# Patient Record
Sex: Male | Born: 1950 | Race: White | Hispanic: No | Marital: Married
Health system: Northeastern US, Academic
[De-identification: ages and names within clinical notes are randomized; demographics above are authoritative.]

---

## 2016-11-28 LAB — HEMOGLOBIN A1C
Estimated Average Glucose mg/dL (INT/EXT): 170 mg/dL
HEMOGLOBIN A1C % (INT/EXT): 7.7 % — ABNORMAL HIGH (ref 0.0–5.6)

## 2017-04-05 LAB — HEMOGLOBIN A1C
Estimated Average Glucose mg/dL (INT/EXT): 204 mg/dL
HEMOGLOBIN A1C % (INT/EXT): 8.7 % — ABNORMAL HIGH (ref 0.0–5.6)

## 2017-04-05 LAB — CMP (EXT)
ALT/SGPT (EXT): 25 U/L (ref 0–40)
AST/SGOT (EXT): 19 U/L (ref 0–33)
Albumin (EXT): 4.7 g/dL (ref 3.3–4.9)
Alkaline Phosphatase (EXT): 69 U/L (ref 0–130)
Anion Gap (EXT): 13 (ref 4–14)
BUN (EXT): 25 mg/dL — ABNORMAL HIGH (ref 6–20)
Bilirubin, Total (EXT): 0.6 mg/dL (ref 0.3–1.2)
CO2 (EXT): 25 meq/L (ref 20–31)
CalciumCalcium (EXT): 9.9 mg/dL (ref 8.4–10.2)
Chloride (EXT): 100 meq/L (ref 98–107)
Corrected Calcium (EXT): 9.3 mg/dL (ref 8.4–10.2)
Creatinine (EXT): 1.1 mg/dL (ref 0.9–1.3)
Glucose (EXT): 132 mg/dL — ABNORMAL HIGH (ref 70–99)
Potassium (EXT): 4.4 meq/L (ref 3.5–5.3)
Protein (EXT): 7.2 g/dL (ref 6.2–8.2)
Sodium (EXT): 138 meq/L (ref 134–144)
eGFR - Creat MDRD (EXT): 75 mL/min/{1.73_m2} (ref >60)

## 2017-05-28 ENCOUNTER — Ambulatory Visit: Admitting: Cardiovascular Disease

## 2017-11-19 LAB — CMP (EXT)
ALT/SGPT (EXT): 28 U/L (ref 0–40)
AST/SGOT (EXT): 18 U/L (ref 0–33)
Albumin (EXT): 4.6 g/dL (ref 3.3–4.9)
Alkaline Phosphatase (EXT): 74 U/L (ref 0–130)
Anion Gap (EXT): 8 (ref 4–14)
BUN (EXT): 19 mg/dL (ref 6–20)
Bilirubin, Total (EXT): 0.6 mg/dL (ref 0.3–1.2)
CO2 (EXT): 27 meq/L (ref 20–31)
CalciumCalcium (EXT): 9.7 mg/dL (ref 8.4–10.2)
Chloride (EXT): 99 meq/L (ref 98–107)
Corrected Calcium (EXT): 9.2 mg/dL (ref 8.4–10.2)
Creatinine (EXT): 0.9 mg/dL (ref 0.9–1.3)
Glucose (EXT): 263 mg/dL — ABNORMAL HIGH (ref 70–99)
Potassium (EXT): 4.5 meq/L (ref 3.5–5.3)
Protein (EXT): 7.2 g/dL (ref 6.2–8.2)
Sodium (EXT): 134 meq/L (ref 134–144)
eGFR - Creat MDRD (EXT): 88 mL/min/{1.73_m2} (ref >60)

## 2017-11-19 LAB — HEMOGLOBIN A1C
Estimated Average Glucose mg/dL (INT/EXT): 284 mg/dL
HEMOGLOBIN A1C % (INT/EXT): 11.1 % — ABNORMAL HIGH (ref 0.0–5.6)

## 2017-12-21 LAB — LIPID PROFILE (EXT)
Chol/HDL Ratio (EXT): 5.1 — ABNORMAL HIGH (ref <4.96)
Cholesterol (EXT): 199 mg/dL (ref <200)
HDL Cholesterol (EXT): 39 mg/dL — ABNORMAL LOW (ref >40)
LDL Cholesterol, CALC (EXT): 110 mg/dL (ref <129)
Triglycerides (EXT): 251 mg/dL — ABNORMAL HIGH (ref <150)

## 2017-12-21 LAB — MICROALBUMIN URINE (EXT)
Albumin, Urine Random (EXT): <7 ug/mL (ref <30)
Creatinine, urine, random (INT/EXT): 20 mg/dL (ref 20–370)
Microalbumin/Creatinine Ratio Urine (EXT): <35 ug/mg{creat} — ABNORMAL HIGH (ref <30)

## 2018-03-12 LAB — CMP (EXT)
ALT/SGPT (EXT): 19 U/L (ref 0–40)
AST/SGOT (EXT): 14 U/L (ref 0–33)
Albumin (EXT): 4.4 g/dL (ref 3.3–4.9)
Alkaline Phosphatase (EXT): 80 U/L (ref 0–130)
Anion Gap (EXT): 8 (ref 4–14)
BUN (EXT): 16 mg/dL (ref 6–20)
Bilirubin, Total (EXT): 0.5 mg/dL (ref 0.3–1.2)
CO2 (EXT): 28 meq/L (ref 20–31)
CalciumCalcium (EXT): 9.5 mg/dL (ref 8.4–10.2)
Chloride (EXT): 100 meq/L (ref 98–107)
Corrected Calcium (EXT): 9.2 mg/dL (ref 8.4–10.2)
Creatinine (EXT): 0.8 mg/dL — ABNORMAL LOW (ref 0.9–1.3)
Glucose (EXT): 145 mg/dL — ABNORMAL HIGH (ref 70–99)
Potassium (EXT): 4.2 meq/L (ref 3.5–5.3)
Protein (EXT): 6.9 g/dL (ref 6.2–8.2)
Sodium (EXT): 136 meq/L (ref 134–144)
eGFR - Creat MDRD (EXT): >100 mL/min/{1.73_m2} (ref >60)

## 2018-03-12 LAB — HEMOGLOBIN A1C
Estimated Average Glucose mg/dL (INT/EXT): 170 mg/dL
HEMOGLOBIN A1C % (INT/EXT): 7.7 % — ABNORMAL HIGH (ref 0.0–5.6)

## 2018-07-11 LAB — HEMOGLOBIN A1C
Estimated Average Glucose mg/dL (INT/EXT): 154 mg/dL
HEMOGLOBIN A1C % (INT/EXT): 7.2 % — ABNORMAL HIGH (ref 0.0–5.6)

## 2018-07-11 LAB — CMP (EXT)
ALT/SGPT (EXT): 18 U/L (ref 0–40)
AST/SGOT (EXT): 14 U/L (ref 0–33)
Albumin (EXT): 4.6 g/dL (ref 3.3–4.9)
Alkaline Phosphatase (EXT): 65 U/L (ref 0–130)
Anion Gap (EXT): 13 (ref 4–14)
BUN (EXT): 18 mg/dL (ref 6–20)
Bilirubin, Total (EXT): 0.5 mg/dL (ref 0.3–1.2)
CO2 (EXT): 23 meq/L (ref 20–31)
CalciumCalcium (EXT): 9.6 mg/dL (ref 8.4–10.2)
Chloride (EXT): 102 meq/L (ref 98–107)
Corrected Calcium (EXT): 9.1 mg/dL (ref 8.4–10.2)
Creatinine (EXT): 1.1 mg/dL (ref <1.3)
Glucose (EXT): 116 mg/dL — ABNORMAL HIGH (ref 70–99)
Potassium (EXT): 4.2 meq/L (ref 3.5–5.3)
Protein (EXT): 7 g/dL (ref 6.2–8.2)
Sodium (EXT): 138 meq/L (ref 134–144)
eGFR - Creat MDRD (EXT): 74 mL/min/{1.73_m2} (ref >60)

## 2018-11-14 LAB — CMP (EXT)
ALT/SGPT (EXT): 20 U/L (ref 0–40)
AST/SGOT (EXT): 12 U/L (ref 0–33)
Albumin (EXT): 4.3 g/dL (ref 3.3–4.9)
Alkaline Phosphatase (EXT): 67 U/L (ref 0–130)
Anion Gap (EXT): 8 (ref 4–14)
BUN (EXT): 23 mg/dL — ABNORMAL HIGH (ref 6–20)
Bilirubin, Total (EXT): 0.4 mg/dL (ref 0.3–1.2)
CO2 (EXT): 28 meq/L (ref 20–31)
CalciumCalcium (EXT): 9.8 mg/dL (ref 8.4–10.2)
Chloride (EXT): 101 meq/L (ref 98–107)
Corrected Calcium (EXT): 9.6 mg/dL (ref 8.4–10.2)
Creatinine (EXT): 1 mg/dL (ref <1.3)
Glucose (EXT): 199 mg/dL — ABNORMAL HIGH (ref 70–99)
Potassium (EXT): 4.8 meq/L (ref 3.5–5.3)
Protein (EXT): 6.5 g/dL (ref 6.2–8.2)
Sodium (EXT): 137 meq/L (ref 134–144)
eGFR - Creat MDRD (EXT): 83 mL/min/{1.73_m2} (ref >60)

## 2018-11-14 LAB — HEMOGLOBIN A1C
Estimated Average Glucose mg/dL (INT/EXT): 177 mg/dL
HEMOGLOBIN A1C % (INT/EXT): 7.9 % — ABNORMAL HIGH (ref 0.0–5.6)

## 2019-01-21 LAB — CMP (EXT)
ALT/SGPT (EXT): 24 U/L (ref 0–40)
AST/SGOT (EXT): 14 U/L (ref 0–33)
Albumin (EXT): 4.4 g/dL (ref 3.3–4.9)
Alkaline Phosphatase (EXT): 70 U/L (ref 0–130)
Anion Gap (EXT): 8 (ref 4–14)
BUN (EXT): 16 mg/dL (ref 6–20)
Bilirubin, Total (EXT): 0.7 mg/dL (ref 0.3–1.2)
CO2 (EXT): 28 meq/L (ref 20–31)
CalciumCalcium (EXT): 9.6 mg/dL (ref 8.4–10.2)
Chloride (EXT): 99 meq/L (ref 98–107)
Corrected Calcium (EXT): 9.3 mg/dL (ref 8.4–10.2)
Creatinine (EXT): 0.9 mg/dL (ref <1.3)
Glucose (EXT): 156 mg/dL — ABNORMAL HIGH (ref 70–99)
Potassium (EXT): 4.4 meq/L (ref 3.5–5.3)
Protein (EXT): 6.7 g/dL (ref 6.2–8.2)
Sodium (EXT): 135 meq/L (ref 134–144)
eGFR - Creat MDRD (EXT): 88 mL/min/{1.73_m2} (ref >60)

## 2019-01-23 ENCOUNTER — Ambulatory Visit

## 2019-01-23 NOTE — Progress Notes (Signed)
* * *      **  [name]**    ------    57 Y old Male, DOB: 1951-03-04    [address], [place], [place], [place] [address]    Home: [phone]    Provider: Georga Kaufmann        * * *    Telephone Encounter    ---    Answered by  Prior, Jasmine December Date: 01/23/2019       Time: 11:54 AM    Caller  Sarah @ Dr Jillyn Hidden    ------            Reason  Consult            Message                     Consult per Dr Mammie Lorenzo  DX:  EKG changes  , Pre Op Cyst removed from Wrist  Maralyn Sago is going to fax today                Action Taken                     Prior,Sharon  01/23/2019 2:09:50 PM >      patient booked      Coroniti,Diana  01/24/2019 1:41:19 PM >                     * * *                ---          * * *         Patient: Spencer, Adams DOB: 1951-08-02 Provider: Georga Kaufmann  01/23/2019    ---    Note generated by eClinicalWorks EMR/PM Software ([email or web address])

## 2019-01-28 ENCOUNTER — Ambulatory Visit: Admitting: Cardiovascular Disease

## 2019-01-28 NOTE — Progress Notes (Signed)
 * * *      Spencer Adams**    ------    55 Y old Male, DOB: 1951-06-23    Account Number: [id number]    [address] [place], [place]    Home: [phone]    Guarantor: Spencer Adams Insurance: [employer]    PCP: Cindee Crazier, MD Referring: Cindee Crazier, MD    Appointment Facility: [facility]        * * *    01/28/2019 Progress Notes: Corita Diego, MD    ------    ---       **Reason for Appointment**    ---      1\. Initial consultation    ---      **History of Present Illness**    ---     __ :    This 68 year old, married man was scheduled to have a ganglion cyst removed  from his left wrist last Friday by Dr. Lenoard Rad. However, there was concerned  about his electrocardiogram which led to this consultation.    He is active including walking regularly with no cardiac symptoms.    Review of systems is negative for chest pain, dyspnea, PND, orthopnea,  palpitations, edema, dizziness, syncope, TIA, stroke, claudication, bleeding,  fatigue, and no amaurosis fugax.    Of interest, about 10 years ago he underwent a coronary angiogram after  developing palpitations and shortness of breath while fishing on [address]. He  was told his coronaries were completely normal. He has not had any cardiac  testing since that point.    His ECG was read as showing possible inferior and septal infarction. We do not  have an old ECG.    He has risk factors for coronary disease including age, gender,  hyperlipidemia, hypertension, diabetes, remote cigarette smoking and family  history.      **Current Medications**    ---    Taking    * Aspirin 81 MG Tablet Delayed Release 1 tablet Orally Once a day    ---    * Lisinopril 20 MG Tablet 1 tablet Orally Once a day    ---    * Simvastatin 20 MG Tablet 1 tablet in the evening Orally Once a day    ---    * MetFORMIN HCl ER 500 MG Tablet Extended Release 24 Hour 2 tablets am, 1 tab pm Orally twice a day    ---    * Medication List reviewed and reconciled  with the patient    ---      **Active Problem List**    ---      Z82.49 Family history of ischemic heart disease and other diseases of the  circulatory system    ------    I10 Hypertension    E78.5 Hyperlipidemia, unspecified    R94.31 Abnormal electrocardiogram [ECG] [EKG]      **Past Medical History**    ---      Hyperlipidemia.        ---    Type II diabetes.        ---    Morbid obestiy.        ---    Hypertension.        ---    Obsturctive sleep apnea.        ---    Ganglion cyst.        ---    Abdominal hernia repair with mesh.        ---  Remote smoking in his 4s.        ---    Family history of heart disease his sister and his father had vascular  dementia.        ---    abnormal ECG with inferior Q waves and poor R wave progression.        ---    coronary angiogram around 2010 at [facility] which was reportedly normal.        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        ---      **Cardiovascular Diagnostics**    ---      coronary angiogram around 2010 the [address] was reportedly normal.    ---     **Family History**    ---      His father had vascular dementia. His sister had heart disease. His other  sister and brother do not have heart.    ---      **Social History**    ---     _Tobacco Use:_    Smoking Patient is a Former smoker, How long has it been since you last  smoked? > 10 years.    _Alcohol History:_    Alcohol Screening Have you had a drink in the past year? No.    He exercises regularly.      **Allergies**    ---      N.K.D.A.    ---      **Review of Systems**    ---     _General/Constitutional_ :    Patient denies fever, chills, night sweats.    _Endocrine_ :    Patient denies polydipsia, polyphagia, hot and cold intolerance.    _Respiratory_ :    Patient denies cough, wheezing.    _Gastrointestinal_ :    Patient denies melena, hematochezia, hematemesis.    _Hematology_ :    Patient denies easy bruising.    _Genitourinary_ :    Patient denies blood in  the urine.    _Musculoskeletal_ :    Patient denies arthralgias, myalgias.    _Peripheral Vascular_ :    Patient denies claudication.    _Skin_ :    Patient denies rash.    _Neurologic_ :    Patient denies focal numbness or weakness, visual disturbance, difficulty  speaking.         **Vital Signs**    ---    BP **137/89** , HR **106** , Ht **70** , Wt **250** , BMI **35.87** , Oxygen  sat % **96**    R137/89    L 150/94.      **Physical Examination**    ---     _General Examination_ :    GENERAL APPEARANCE: Alert, well nourished, chaperone present in room.    HEAD: Normocephalic.    EYES: Symmetric pupils, anicteric sclera.    ORAL CAVITY: Moist mucous membranes .    NECK/THYROID: No jugular venous distention, normal carotid upstrokes, no  bruits .    SKIN: No rashes.    HEART: Regular rhythm, normal S1, normal S2, no S3, positive S4, no click, no  rub .    LUNGS: Clear to auscultation bilaterally .    CHEST: No costochondral tenderness.    ABDOMEN: Soft, positive bowel sounds, no abdominal or flank bruits, no  hepatosplenomegaly.    MUSCULOSKELETAL: No muscular tenderness.    EXTREMITIES: No edema.    PERIPHERAL PULSES: Posterior tibial pulses are normal and symmetric  bilaterally, radial pulses are normal and symmetric bilaterally.    NEUROLOGIC: Equal strength and sensation bilaterally .    PSYCH: Alert and oriented .         **EKG**    ---    Sinus rhythm at 103 bpm Q waves in lead III and aVF as well as poor R wave  progression. These could be normal variant QRS findings.      **Assessment and Plan**    ---    1\. Family history of ischemic heart disease and other diseases of the  circulatory system - Z82.49    ---    2\. Hypertension - I10    ---    3\. Hyperlipidemia, unspecified - E78.5    ---    4\. Abnormal electrocardiogram [ECG] [EKG] - R94.31    ---     IMPRESSION:    This 68 year old man is active with no cardiac symptoms. His ECG shows  suggestive but not diagnostic findings for possible old  inferior infarction  and with poor R wave progression. As he has risk factors for coronary artery  disease, this may well represent a cardiac event in the past. However, with an  active lifestyle without cardiac symptoms, I think he could proceed with  ganglion cyst surgery with no additional cardiac testing.    For thorough evaluation, we have decided to do an echocardiogram and a  pharmacological nuclear stress test. If these tests are normal, he could  consider a coronary calcium score. Of note, he cannot exercise well on a  treadmill because he is disabled secondary to spine disease.    If never done, he should have screening for AAA as he is male, between 48 and  44 and a previous smoker. He may have had imaging of his aorta incidentally  around the time he had hernia surgery on his abdomen.    I recommend he continue aspirin, simvastatin and lisinopril.    I will speak with him on the phone after his test results return.    Thank you for allowing me to take part in your patient's care. Please keep me  up to date with any blood tests or other relevant clinical information for  this patient.    With warm regards.    ---      **Treatment**    ---      **1\. Others**    Continue Aspirin Tablet Delayed Release, 81 MG, 1 tablet, Orally, Once a day    Continue Lisinopril Tablet, 20 MG, 1 tablet, Orally, Once a day    Continue Simvastatin Tablet, 20 MG, 1 tablet in the evening, Orally, Once a  day    ---       **Diagnostic Imaging**    ------    _Imaging: Electrocardiogram (EKG)_    ---    _Imaging: Myocardial Perfusion Imaging - Pharmacologic with Exercise_    _Imaging: Echo with 2D Doppler & Color Flow, transthoracic; with contrast if  indicated per protocol_      **Preventive Medicine**    ---      Counseling: Exercise Exercise 30 minutes a day, BMI Management Yes. Diet  Low Salt 2000 mg sodium per day, Avoid Desserts, Avoid Snacks, Avoid Fast  Foods, Avoid Processed Foods, Weight Reduction, Mediterranean  (PREDIMED) diet.    ---     **Follow Up**    ---    6 Months (Reason: abnormal ECG, diabetes, hypertension, hyperlipidemia, family  history of heart disease and remote cigarette  usage.)    Electronically signed by Corita Diego , MD on 01/28/2019 at 02:40 PM EDT    Sign off status: Completed        * * [facility]    [address]    [place], [place] [id number]    Tel: [phone]    Fax: [phone]              * * *         Patient: Spencer Adams, Spencer Adams DOB: 1951-05-19 Progress Note: Corita Diego, MD  01/28/2019    ---    Note generated by eClinicalWorks EMR/PM Software ([email or web address])

## 2019-01-30 ENCOUNTER — Ambulatory Visit

## 2019-01-31 ENCOUNTER — Ambulatory Visit: Admitting: Cardiovascular Disease

## 2019-01-31 NOTE — Progress Notes (Signed)
* * *      **  [name]**    ------    89 Y old Male, DOB: [birth year]    [address] [place], [place], [place], [place] [address]    Home: [phone]    Provider: Leodis Rains        * * *    Telephone Encounter    ---    Answered by  Theo Dills Date: 01/31/2019       Time: 03:55 PM    Reason  Meds for 02/03/19 nuc    ------            Message                     Nurse, nurse:  Pt having pharm w/ exercise nuc Mon 02/03/19 at 9:45 ordered by Dr Sandria Manly; wants to know what to do about his meds for night before & morning of;  also would like results of his echo that he had 01/30/19; results in ecw.  [phone].  Allayne Butcher                Action Taken                     Gavric,Lilly  01/31/2019 4:02:40 PM > Hold metformin and bring it with him  correct? Are you able to give him his echo results? Thanks      Bernardi,Cori  01/31/2019 4:19:03 PM > Spoke to Jonny Ruiz, reviewed echo and med instructions                    * * *                ---          * * *         Patient: Tan, Clopper DOB: [birth year] Provider: Leodis Rains 01/31/2019    ---    Note generated by eClinicalWorks EMR/PM Software ([email or web address])

## 2019-01-31 NOTE — Progress Notes (Signed)
* * *      **  [name]**    ------    58 Y old Male, DOB: 09/11/1951    [address] [place], [place], [place], [place] [address]    Home: [phone]    Provider: Leodis Rains        * * *    Telephone Encounter    ---    Answered by  Leodis Rains Date: 01/31/2019       Time: 03:19 PM    Message                     echo noted        ------            Action Taken                     Alexsia Klindt G 01/31/2019 3:19:51 PM > echocardiogram shows normal left ventricle size and function making the ECG finding a pulse abnormality.  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  I will call him with the nuclear stress test results when available.      Ervie Mccard G 02/03/2019 4:00:58 PM >                     * * *                ---          * * *         Patient: [name], [name] DOB: 05-10-1951 Provider: Leodis Rains 01/31/2019    ---    Note generated by eClinicalWorks EMR/PM Software ([email or web address])

## 2019-02-03 ENCOUNTER — Ambulatory Visit: Admitting: Internal Medicine

## 2019-02-04 ENCOUNTER — Ambulatory Visit

## 2019-02-04 ENCOUNTER — Ambulatory Visit: Admitting: Cardiovascular Disease

## 2019-02-04 NOTE — Progress Notes (Signed)
* * *      **  Spencer Adams**    ------    69 Y old Male, DOB: July 14, 1951    [address] [place], [place], [place], [place] [address]    Home: [phone]    Provider: Leodis Rains        * * *    Telephone Encounter    ---    Answered by  Leodis Rains Date: 02/04/2019       Time: 01:00 PM    Message                     MIBI shows no ischemia Results reviewed with him. OK for surgery. I explained he could still have CAD and that angiogram or at least a coronary calcium score could look for it. DGL        ------            Action Taken                     Taneil Lazarus G 02/04/2019 1:01:14 PM >                     * * *                ---          * * *         Patient: Spencer, Adams DOB: 07/01/1951 Provider: Leodis Rains 02/04/2019    ---    Note generated by eClinicalWorks EMR/PM Software ([email or web address])

## 2019-04-10 ENCOUNTER — Ambulatory Visit

## 2019-07-23 ENCOUNTER — Ambulatory Visit: Admitting: Cardiovascular Disease

## 2019-07-23 NOTE — Progress Notes (Signed)
 * * *      Spencer Adams**    ------    45 Y old Male, DOB: 1951-10-25    Account Number: [id number]    [address] [place], [place], [place]    Home: [phone]    Guarantor: Spencer Adams Insurance: [employer]    PCP: Mammie Lorenzo, MD Referring: Mammie Lorenzo, MD    Appointment Facility: [facility]        * * *    07/23/2019 Follow Up: Crosby Oyster, MD    ------    ---       **Reason for Appointment**    ---      1\. Follow-up hypertension, hyperlipidemia, family history of vascular  disease    ---      **History of Present Illness**    ---     __ :    He did very well with ganglion cyst removal by Dr. Thereasa Distance. He is now scheduled  to see a dermatologist regarding a skin lesion on his upper back. 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    Review of systems is negative for chest pain, dyspnea, PND, orthopnea,  palpitations, edema, dizziness, syncope, TIA, stroke, claudication, bleeding,  fatigue, and no amaurosis fugax.    He had a colonoscopy by Dr. Dorian Pod and had several tubular adenomas removed.      **Current Medications**    ---    Taking    * Lisinopril 20 MG Tablet 1 tablet Orally Once a day    ---    * Simvastatin 20 MG Tablet 1 tablet in the evening Orally Once a day    ---    * Aspirin 81 MG Tablet Delayed Release 1 tablet Orally Once a day    ---    * MetFORMIN HCl ER 500 MG Tablet Extended Release 24 Hour 2 tablets am, 1 tab pm Orally twice a day    ---    * Medication List reviewed and reconciled with the patient    ---      **Active Problem List**    ---      Z82.49 Family history of ischemic heart disease and other diseases of the  circulatory system    ------    I10 Hypertension    E78.5 Hyperlipidemia, unspecified    R94.31 Abnormal electrocardiogram [ECG] [EKG]      **Past Medical History**    ---      Hyperlipidemia.        ---    Type II diabetes.        ---    Morbid obestiy.        ---    Hypertension.        ---    Ganglion cyst.         ---    Abdominal hernia repair with mesh.        ---    Remote smoking in his 61s.        ---    Family history of heart disease his sister and his father had vascular  dementia.        ---    abnormal ECG with inferior Q waves and poor R wave progression.        ---    coronary angiogram around 2010 at [facility] which was reportedly normal.        ---    Spinal degenerative joint disease leading to  disability.        ---      **Cardiovascular Diagnostics**    ---      coronary angiogram around 2010 the [address] was reportedly normal.    ---    ECHO 01/30/19: LVEF 60-65%. DD. Trace MR/AR/TR/PR. PASP normal. Ao root 3.9  cm/asc aorta 3.6 cm.    ---    NUCLEAR ETT; 02/03/19: 6:00 Bruce; 5.8 METs; 23,360 DP; 94% APMHR. No sx/ST  changes. No WMA. EF 54%. No ischemia + no infarct.    ---     **Family History**    ---      His father had vascular dementia. His sister had heart disease. His other  sister and brother do not have heart.    ---      **Social History**    ---     _Tobacco Use:_    Smoking Patient is a Former smoker, How long has it been since you last  smoked? > 10 years.    _Alcohol History:_    Alcohol Screening Have you had a drink in the past year? No.    He exercises regularly.      **Allergies**    ---      N.K.D.A.    ---      **Review of Systems**    ---     _General/Constitutional_ :    Patient denies fever, chills, night sweats.    _Endocrine_ :    Patient denies polydipsia, polyphagia, hot and cold intolerance.    _Respiratory_ :    Patient denies cough, wheezing.    _Gastrointestinal_ :    Patient denies melena, hematochezia, hematemesis.    _Hematology_ :    Patient denies easy bruising.    _Genitourinary_ :    Patient denies blood in the urine.    _Musculoskeletal_ :    Patient denies arthralgias, myalgias.    _Peripheral Vascular_ :    Patient denies claudication.    _Skin_ :    Patient denies rash.    _Neurologic_ :    Patient denies focal numbness or weakness, visual disturbance,  difficulty  speaking.         **Vital Signs**    ---    BP **137/89** , HR **106** , Ht **70** , Wt **250** , BMI **35.87** , Oxygen  sat % **96**.      **Physical Examination**    ---     _General Examination_ :    EYES: Symmetric pupils, anicteric.    ORAL CAVITY: Moist mucous membranes .    NECK/THYROID: No jugular venous distention, normal carotid upstrokes, no  bruits .    SKIN:  He has multiple various colored nevi. The lesion he is worried about is  tan and slightly verrucous and a little nodular. It looks most consistent with  seborrheic keratosis.Marland Kitchen    HEART: Regular rhythm, normal S1, normal S2, no S3, positive S4, no click, no  rub .    LUNGS: Clear to auscultation bilaterally .    ABDOMEN: Soft, positive bowel sounds, no abdominal or flank bruits.    MUSCULOSKELETAL: No muscular tenderness.    EXTREMITIES: No edema.    PERIPHERAL PULSES: Posterior tibial pulses are normal and symmetric  bilaterally.    NEUROLOGIC: Equal strength and sensation bilaterally .    PSYCH: Alert and oriented .         **EKG**    ---    Sinus rhythm at 75 bpm with Q waves in  lead III and aVF which are unchanged.  Normal intervals and axis.      **Assessment and Plan**    ---    1\. Family history of ischemic heart disease and other diseases of the  circulatory system - Z82.49 (Primary)    ---    2\. Hypertension - I10    ---    3\. Hyperlipidemia, unspecified - E78.5    ---    4\. Abnormal electrocardiogram [ECG] [EKG] - R94.31    ---     IMPRESSION:    He is active with no concerning cardiac symptoms.    I do not have recent blood test results. I would target an LDL below 100 if  not below 70 in the presence of diabetes.    Without cardiac symptoms, I have ordered no additional cardiac testing.    I will be interested in the opinion of the dermatologist.    Of note, the risk and benefit calculation for him regarding aspirin is unclear  at this point.    Thank you for allowing me to take part in your patient's care. Please  keep me  up to date with any blood tests or other relevant clinical information for  this patient.    With warm regards.    ---      **Treatment**    ---      **1\. Family history of ischemic heart disease and other diseases of the  circulatory system**    Continue Lisinopril Tablet, 20 MG, 1 tablet, Orally, Once a day    Continue Simvastatin Tablet, 20 MG, 1 tablet in the evening, Orally, Once a  day    Continue Aspirin Tablet Delayed Release, 81 MG, 1 tablet, Orally, Once a day    ---       **Diagnostic Imaging**    ------    _Imaging: Electrocardiogram (EKG)_    ---      **Preventive Medicine**    ---      Counseling: Exercise Exercise 30 minutes a day, BMI Management Yes. Diet  Low Salt 2000 mg sodium per day, Avoid Desserts, Avoid Snacks, Avoid Fast  Foods, Avoid Processed Foods, Weight Reduction, Mediterranean (PREDIMED) diet.    ---     **Follow Up**    ---    1 Year (Reason: family history of vascular disease, hypertension,  hyperlipidemia, diabetes)    Electronically signed by Crosby Oyster , MD on 07/23/2019 at 10:29 AM EDT    Sign off status: Completed        * * [facility]    [address]    [place], [place] [address]    Tel: [phone]    Fax: [phone]              * * *         Patient: Caylin, Raby DOB: 28-Jun-1951 Progress Note: Crosby Oyster, MD  07/23/2019    ---    Note generated by eClinicalWorks EMR/PM Software ([email or web address])

## 2019-07-24 ENCOUNTER — Ambulatory Visit

## 2019-08-21 ENCOUNTER — Ambulatory Visit

## 2019-09-03 LAB — CMP (EXT)
ALT/SGPT (EXT): 21 U/L (ref 0–40)
AST/SGOT (EXT): 14 U/L (ref 0–33)
Albumin (EXT): 4.4 g/dL (ref 3.3–4.9)
Alkaline Phosphatase (EXT): 71 U/L (ref 0–130)
Anion Gap (EXT): 10 (ref 4–14)
BUN (EXT): 26 mg/dL — ABNORMAL HIGH (ref 6–20)
Bilirubin, Total (EXT): 0.6 mg/dL (ref 0.3–1.2)
CO2 (EXT): 28 meq/L (ref 20–31)
CalciumCalcium (EXT): 9.8 mg/dL (ref 8.4–10.2)
Chloride (EXT): 100 meq/L (ref 98–107)
Corrected Calcium (EXT): 9.5 mg/dL (ref 8.4–10.2)
Creatinine (EXT): 0.9 mg/dL (ref <1.3)
Glucose (EXT): 185 mg/dL — ABNORMAL HIGH (ref 70–99)
Potassium (EXT): 4.6 meq/L (ref 3.5–5.3)
Protein (EXT): 6.6 g/dL (ref 6.2–8.2)
Sodium (EXT): 138 meq/L (ref 135–146)
eGFR - Creat MDRD (EXT): 86 mL/min/{1.73_m2} (ref >60)

## 2019-09-03 LAB — LIPID PROFILE (EXT)
Chol/HDL Ratio (EXT): 4.38 (ref <4.96)
Cholesterol (EXT): 184 mg/dL (ref <200)
HDL Cholesterol (EXT): 42 mg/dL (ref >40)
LDL Cholesterol, CALC (EXT): 86 mg/dL (ref <129)
Triglycerides (EXT): 278 mg/dL — ABNORMAL HIGH (ref <150)

## 2019-09-03 LAB — PSA (EXT): PSA (EXT): 1.12 ng/mL (ref <4.00)

## 2019-09-03 LAB — HEMOGLOBIN A1C
Estimated Average Glucose mg/dL (INT/EXT): 190 mg/dL
HEMOGLOBIN A1C % (INT/EXT): 8.3 % — ABNORMAL HIGH (ref 0.0–5.6)

## 2019-09-18 LAB — MICROALBUMIN URINE (EXT)
Creatinine, urine, random (INT/EXT): 34 mg/dL (ref 20–370)
MICROALBUMIN, URINE (EXT): 15 ug/mL (ref <30)
Microalbumin/Creatinine Ratio Urine (EXT): 46 ug/mg{creat} — ABNORMAL HIGH (ref <30)

## 2019-12-09 LAB — BMP (EXT)
Anion Gap (EXT): 12 (ref 4–14)
BUN (EXT): 15 mg/dL (ref 6–20)
CO2 (EXT): 28 meq/L (ref 20–31)
CalciumCalcium (EXT): 9.8 mg/dL (ref 8.4–10.2)
Chloride (EXT): 97 meq/L — ABNORMAL LOW (ref 98–107)
Creatinine (EXT): 0.9 mg/dL (ref <1.3)
Glucose (EXT): 175 mg/dL — ABNORMAL HIGH (ref 70–99)
Potassium (EXT): 4.2 meq/L (ref 3.5–5.3)
Sodium (EXT): 137 meq/L (ref 135–146)
eGFR - Creat MDRD (EXT): 92 mL/min/{1.73_m2} (ref >60)

## 2019-12-16 ENCOUNTER — Ambulatory Visit: Admitting: Cardiovascular Disease

## 2020-04-30 ENCOUNTER — Ambulatory Visit: Admitting: Cardiovascular Disease

## 2020-04-30 LAB — HEMOGLOBIN A1C
Estimated Average Glucose mg/dL (INT/EXT): 224 mg/dL
HEMOGLOBIN A1C % (INT/EXT): 9.3 % — ABNORMAL HIGH (ref 0.0–5.6)

## 2020-04-30 LAB — CMP (EXT)
ALT/SGPT (EXT): 27 U/L (ref 0–40)
AST/SGOT (EXT): 17 U/L (ref 0–33)
Albumin (EXT): 4.4 g/dL (ref 3.3–4.9)
Alkaline Phosphatase (EXT): 70 U/L (ref 0–130)
Anion Gap (EXT): 11 (ref 4–14)
BUN (EXT): 12 mg/dL (ref 6–20)
Bilirubin, Total (EXT): 0.5 mg/dL (ref 0.3–1.2)
CO2 (EXT): 26 meq/L (ref 20–31)
CalciumCalcium (EXT): 9.4 mg/dL (ref 8.4–10.2)
Chloride (EXT): 100 meq/L (ref 98–107)
Corrected Calcium (EXT): 9.1 mg/dL (ref 8.4–10.2)
Creatinine (EXT): 0.8 mg/dL (ref <1.3)
Glucose (EXT): 179 mg/dL — ABNORMAL HIGH (ref 70–99)
Potassium (EXT): 4.2 meq/L (ref 3.5–5.3)
Protein (EXT): 6.7 g/dL (ref 6.2–8.2)
Sodium (EXT): 137 meq/L (ref 135–146)
eGFR - Creat MDRD (EXT): 97 mL/min/{1.73_m2} (ref >60)

## 2020-07-24 LAB — HX CBC W/ DIFF
HX BASO#: 0.1 10*3/uL (ref 0.0–0.1)
HX BASO%: 0.8 %
HX EOS#: 0.2 10*3/uL (ref 0.0–0.5)
HX EOS%: 1.7 %
HX HCT: 44.8 % (ref 42.0–54.0)
HX HGB: 15 g/dL (ref 14.0–18.0)
HX LYMPH#: 2.5 10*3/uL (ref 1.0–4.0)
HX LYMPH%: 28.4 %
HX MCH: 29.6 pg (ref 27.0–32.0)
HX MCHC: 33.5 g/dL (ref 32.0–36.0)
HX MCV: 88.4 FL (ref 80.0–94.0)
HX MONO#: 0.8 10*3/uL (ref 0.0–1.5)
HX MONO%: 8.9 %
HX NUCLEATED RBC %: 0 /100{WBCs}
HX PLT: 215 10*3/uL (ref 150–400)
HX RBC: 5.07 10*6/uL (ref 4.50–5.90)
HX RDW: 12.8 % (ref 11.5–14.5)
HX SEG%: 59.9 %
HX SEGS#: 5.2 10*3/uL (ref 2.4–8.1)
HX WBC: 8.7 10*3/uL (ref 4.0–11.0)

## 2020-07-24 LAB — HX COMPREHENSIVE METABOLIC PANEL
HX ALBUMIN: 4.4 g/dL (ref 3.4–4.8)
HX ALK PHOS TOTAL: 83 U/L (ref 25–165)
HX ALT/SGPT: 19 U/L (ref 10–44)
HX ANION GAP: 10 mmol/L (ref 9–18)
HX AST/SGOT: 13 U/L (ref 10–44)
HX BILIRUBIN TOTAL: 0.3 mg/dL (ref 0.2–1.0)
HX BUN: 25 mg/dL — ABNORMAL HIGH (ref 8–21)
HX CALCIUM: 9.9 mg/dL (ref 8.6–10.3)
HX CARBON DIOXIDE: 27 mmol/L (ref 22–30)
HX CHLORIDE: 99 mmol/L (ref 96–104)
HX CREATININE: 1.11 mg/dL (ref 0.67–1.17)
HX ESTIMATED GFR, AFRICAN-AMERICA: >60 mL/min (ref >60)
HX ESTIMATED GFR: >60 mL/min (ref >60)
HX GLUCOSE: 206 mg/dL — ABNORMAL HIGH (ref 70–105)
HX POTASSIUM: 5.2 mmol/L — ABNORMAL HIGH (ref 3.5–5.1)
HX SODIUM: 136 mmol/L (ref 135–145)
HX TOTAL PROTEIN: 6.7 g/dL (ref 6.2–8.4)

## 2020-07-28 ENCOUNTER — Ambulatory Visit: Admitting: Cardiovascular Disease

## 2020-07-28 ENCOUNTER — Ambulatory Visit

## 2020-07-28 NOTE — Progress Notes (Signed)
* * *      Spencer Adams, Spencer Adams **DOB:** 11-01-1951 (69 yo M) **Acc No.** [id number] **DOS:**  07/28/2020    ---       Spencer Adams**    ------    12 Y old Male, DOB: 20-Jun-1951    [address], [place], [place], [place] [address]    Home: [phone]    Provider: Leodis Rains        * * *    Telephone Encounter    ---    Answered by  Durel Salts Date: 07/28/2020       Time: 12:18 PM    Caller  Cat    ------            Reason  N/S for [facility] 07/28/20            Message                     Pt N/S for [facility] 07/28/20                Action Taken                     [employer]  07/28/2020 2:30:38 PM > letter sent                     * * *                ---          * * *        Provider: Leodis Rains 07/28/2020    ---    Note generated by eClinicalWorks EMR/PM Software ([email or web address])

## 2020-08-05 LAB — HEMOGLOBIN A1C
Estimated Average Glucose mg/dL (INT/EXT): 180 mg/dL
HEMOGLOBIN A1C % (INT/EXT): 8 % — ABNORMAL HIGH (ref 0.0–5.6)

## 2020-09-08 ENCOUNTER — Ambulatory Visit (HOSPITAL_BASED_OUTPATIENT_CLINIC_OR_DEPARTMENT_OTHER)

## 2020-09-08 ENCOUNTER — Ambulatory Visit: Admitting: Cardiovascular Disease

## 2020-09-08 NOTE — Progress Notes (Signed)
 * * Spencer Adams **DOB:** 03/17/1951 (69 yo M) **Acc No.** [id number] **DOS:**  09/08/2020    ---       Spencer Adams**    ------    69 Y old Male, DOB: [birth year]    Account Number: [id number]    [address] [place], [place], [id number]    Home: [phone]    Guarantor: Spencer Adams Insurance: [employer]    PCP: Mammie Lorenzo, MD Referring: Mammie Lorenzo, MD    Appointment Facility: [facility]        * * *    09/08/2020 Follow Up: Crosby Oyster, MD    ------    ---       **Reason for Appointment**    ---      1\. Follow-up hypertension, hyperlipidemia, family history of vascular  disease    ---      **History of Present Illness**    ---     __ :    He did very well with ganglion cyst removal by Dr. Thereasa Distance. He is now scheduled  to see a dermatologist regarding a skin lesion on his upper back. 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    As of September 08, 2020, he has been active walking his cocker spaniel up to a  mile with no symptoms.    Review of systems is negative for chest pain, dyspnea, PND, orthopnea,  palpitations, edema, dizziness, syncope, TIA, stroke, claudication, bleeding,  fatigue, and no amaurosis fugax.    He had a colonoscopy by Dr. Dorian Pod and had several tubular adenomas removed.      **Current Medications**    ---    Taking    * Lisinopril 20 MG Tablet 1 tablet Orally Once a day    ---    * Atorvastatin Calcium 20 MG Tablet 1 tablet Orally Once a day    ---    * Aspirin 81 MG Tablet Delayed Release 1 tablet Orally Once a day    ---    * Jardiance 25 MG Tablet 1 tablet Orally Once a day    ---    * metFORMIN HCl ER 500 MG Tablet Extended Release 24 Hour 1 tablet Orally three times a day    ---    Medication List reviewed and reconciled with the patient    ---      **Active Problem List**    ---      Z82.49 Family history of ischemic heart disease and other diseases of the  circulatory system       **Modified On:**    09/08/2020     **W/U  Status:**    confirmed        ------    I10 Hypertension       **Modified On:**    09/08/2020     **W/U Status:**    confirmed        E78.5 Hyperlipidemia, unspecified       **Modified On:**    09/08/2020     **W/U Status:**    confirmed        R94.31 Abnormal electrocardiogram [ECG] [EKG]       **Modified On:**    09/08/2020     **W/U Status:**    confirmed          **Past Medical History**    ---      Hyperlipidemia.        ---  Type II diabetes.        ---    Morbid obestiy.        ---    Hypertension.        ---    Ganglion cyst.        ---    Abdominal hernia repair with mesh.        ---    Remote smoking in his 25s.        ---    Family history of heart disease his sister and his father had vascular  dementia.        ---    abnormal ECG with inferior Q waves and poor R wave progression.        ---    coronary angiogram around 2010 at [facility] which was reportedly normal.        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        ---    Hypogonadism.        ---      **Cardiovascular Diagnostics**    ---      coronary angiogram around 2010 the [address] was reportedly normal.    ---    ECHO 01/30/19: LVEF 60-65%. DD. Trace MR/AR/TR/PR. PASP normal. Ao root 3.9  cm/asc aorta 3.6 cm.    ---    NUCLEAR ETT; 02/03/19: 6:00 Bruce; 5.8 METs; 23,360 DP; 94% APMHR. No sx/ST  changes. No WMA. EF 54%. No ischemia + no infarct.    ---    CT abd/pelvis 12-16-19 fatty liver, colon diverticulosis, atherosclerosis, DJD  of spine    ---     **Family History**    ---      His father had vascular dementia. His sister had heart disease. His other  sister and brother do not have heart.    ---      **Social History**    ---     _Tobacco Use:_    Smoking Patient is a Former smoker, How long has it been since you last  smoked? > 10 years.    He exercises regularly.      **Allergies**    ---      N.K.D.A.    ---      **Review of Systems**    ---     _General/Constitutional_ :    Patient denies fever,  chills, night sweats.    _Endocrine_ :    Patient denies polydipsia, polyphagia, hot and cold intolerance.    _Respiratory_ :    Patient denies cough, wheezing.    _Gastrointestinal_ :    Patient denies melena, hematochezia, hematemesis.    _Hematology_ :    Patient denies easy bruising.    _Genitourinary_ :    Patient denies blood in the urine.    _Musculoskeletal_ :    Patient denies arthralgias, myalgias.    _Peripheral Vascular_ :    Patient denies claudication.    _Skin_ :    Patient denies rash.    _Neurologic_ :    Patient denies focal numbness or weakness, visual disturbance, difficulty  speaking.         **Vital Signs**    ---    BP **128/78** , HR **86** , Ht 70, Wt **236** , BMI **33.86** , Oxygen sat %  **96** , Temp **98.0**.      **Physical Examination**    ---     _General Examination:_    EYES: Symmetric pupils, anicteric.  ORAL CAVITY: Moist mucous membranes .    NECK/THYROID: No jugular venous distention, normal carotid upstrokes, no  bruits .    HEART: Regular rhythm, normal S1, normal S2, no S3, positive S4, no click, no  rub .    LUNGS: Clear to auscultation bilaterally .    ABDOMEN: Soft, positive bowel sounds, no abdominal or flank bruits.    MUSCULOSKELETAL: No muscular tenderness.    EXTREMITIES: No edema.    PERIPHERAL PULSES: Posterior tibial pulses are normal and symmetric  bilaterally.    NEUROLOGIC: Equal strength and sensation bilaterally .    PSYCH: Alert and oriented .         **EKG**    ---    Sinus rhythm at 86 bpm with intervals 148/94/39 with poor R wave progression  and borderline left atrial enlargement.      **Assessment and Plan**    ---    1\. Family history of ischemic heart disease and other diseases of the  circulatory system - Z82.49 (Primary)    ---    2\. Hypertension - I10    ---    3\. Hyperlipidemia, unspecified - E78.5    ---    4\. Abnormal electrocardiogram [ECG] [EKG] - R94.31    ---     IMPRESSION:    He is recently active with no cardiac symptoms.     Ideally, he would exercise more and lose weight.    For now, I made no changes in his regimen and ordered no cardiac testing.    Thank you for allowing me to take part in your patient's care. Please keep me  up to date with any blood tests or other relevant clinical information for  this patient.    With warm regards.. I spent 30 minutes today preparing to see the patient,  reviewing medications and tests, documenting clinical information in the  electronic medical record, seeing the patient face to face.    ---      **Treatment**    ---      **1\. Family history of ischemic heart disease and other diseases of the  circulatory system**    Continue Lisinopril Tablet, 20 MG, 1 tablet, Orally, Once a day    Continue Atorvastatin Calcium Tablet, 20 MG, 1 tablet, Orally, Once a day    Continue Aspirin Tablet Delayed Release, 81 MG, 1 tablet, Orally, Once a day    Continue Jardiance Tablet, 25 MG, 1 tablet, Orally, Once a day    ---       **Diagnostic Imaging**    ------    _Imaging: Electrocardiogram (EKG)_    ---      **Preventive Medicine**    ---      Counseling: Exercise BMI Management Yes, Exercise 30 minutes a day. Diet  Low Salt 2000 mg sodium per day, Avoid Desserts, Avoid Snacks, Avoid Fast  Foods, Avoid Processed Foods, Weight Reduction, Mediterranean (PREDIMED) diet.    ---     **Follow Up**    ---    1 Year (Reason: risk factors, HTN, HL, +FHx)    Electronically signed by Crosby Oyster , MD on 09/09/2020 at 05:47 PM EDT    Sign off status: Completed        * * [facility]    [address]    [place], [place] [id number]    Tel: [phone]    Fax: [phone]              * * *  Progress Note: Crosby Oyster, MD 09/08/2020    ---    Note generated by eClinicalWorks EMR/PM Software ([email or web address])

## 2020-09-09 ENCOUNTER — Ambulatory Visit: Admitting: Cardiovascular Disease

## 2020-09-09 NOTE — Progress Notes (Signed)
* * *      Spencer Adams, Spencer Adams **DOB:** May 03, 1951 (69 yo M) **Acc No.** [id number] **DOS:**  09/09/2020    ---       Spencer Adams**    ------    75 Y old Male, DOB: 03/16/1951    [address] [place], [place], [place], [place] [address]    Home: [phone]    Provider: Leodis Rains        * * *    Telephone Encounter    ---    Answered by  Trenton Gammon Date: 09/09/2020       Time: 10:41 AM    Caller  patients wife    ------            Reason  new insurance /reimbursed            Message                     Patient was charged a copay on 10/20 because he was not asked about new insurance and he forgot to give new insurance, I changed it to the correct insurance ([employer] cross) patient did pay a copay of $60 and will need to be reimbursed                Action Taken                     [facility]  09/09/2020 3:17:24 PM > Credited patient's credit card and mailed them the documentation as they requested. The ov charge is not billed yet so i moved the money to an old DOS so i could refund the credit card.                    * * *                ---          * * *        Provider: Leodis Rains 09/09/2020    ---    Note generated by eClinicalWorks EMR/PM Software ([email or web address])

## 2020-09-09 NOTE — Progress Notes (Signed)
* * *      Spencer Adams, Spencer Adams **DOB:** 06/08/1951 (69 yo M) **Acc No.** [id number] **DOS:**  09/09/2020    ---       [name]**    ------    86 Y old Male, DOB: May 25, 1951    [address] [place], [place], [place] [address]    Home: [phone]    Provider: Leodis Rains        * * *    Telephone Encounter    ---    Answered by  Jackelyn Knife Date: 09/09/2020       Time: 02:21 PM    Reason  Amend 09/08/20 Note    ------            Message                     Please add time spent on visit to your note. Thanks, [employer] Taken                     ok                    * * *                ---          * * *        Provider: Leodis Rains 09/09/2020    ---    Note generated by eClinicalWorks EMR/PM Software ([email or web address])

## 2020-11-11 LAB — MICROALBUMIN URINE (EXT)
Creatinine, urine, random (INT/EXT): 87 mg/dL (ref 20–370)
MICROALBUMIN, URINE (EXT): 42 ug/mL — ABNORMAL HIGH (ref <30)
Microalbumin/Creatinine Ratio Urine (EXT): 48 ug/mg{creat} — ABNORMAL HIGH (ref <30)

## 2020-11-12 LAB — CMP (EXT)
ALT/SGPT (EXT): 22 U/L (ref 0–40)
AST/SGOT (EXT): 14 U/L (ref 0–33)
Albumin (EXT): 4.7 g/dL (ref 3.3–4.9)
Alkaline Phosphatase (EXT): 77 U/L (ref 0–130)
Anion Gap (EXT): 13 (ref 4–14)
BUN (EXT): 23 mg/dL — ABNORMAL HIGH (ref 6–20)
Bilirubin, Total (EXT): 0.9 mg/dL (ref 0.3–1.2)
CO2 (EXT): 26 meq/L (ref 20–31)
CalciumCalcium (EXT): 9.7 mg/dL (ref 8.4–10.2)
Chloride (EXT): 100 meq/L (ref 98–107)
Corrected Calcium (EXT): 9.1 mg/dL (ref 8.4–10.2)
Creatinine (EXT): 0.9 mg/dL (ref <1.3)
Glucose (EXT): 117 mg/dL — ABNORMAL HIGH (ref 70–99)
Potassium (EXT): 4.5 meq/L (ref 3.5–5.3)
Protein (EXT): 7.3 g/dL (ref 6.2–8.2)
Sodium (EXT): 139 meq/L (ref 135–146)
eGFR - Creat MDRD (EXT): 88 mL/min/{1.73_m2} (ref >60)

## 2020-11-12 LAB — LDL (EXT): LDL Cholesterol (EXT): 98 mg/dL (ref 62–130)

## 2020-11-12 LAB — LIPID PROFILE (EXT)
Chol/HDL Ratio (EXT): 5.23 — ABNORMAL HIGH (ref <4.96)
Cholesterol (EXT): 225 mg/dL — ABNORMAL HIGH (ref <200)
HDL Cholesterol (EXT): 43 mg/dL (ref >40)
Triglycerides (EXT): 506 mg/dL — ABNORMAL HIGH (ref <150)

## 2020-11-12 LAB — HEMOGLOBIN A1C
Estimated Average Glucose mg/dL (INT/EXT): 177 mg/dL
HEMOGLOBIN A1C % (INT/EXT): 7.9 % — ABNORMAL HIGH (ref 0.0–5.6)

## 2021-05-10 LAB — MICROALBUMIN URINE (EXT)
Creatinine, urine, random (INT/EXT): 47 mg/dL (ref 20–370)
MICROALBUMIN, URINE (EXT): <7 ug/mL (ref <30)
Microalbumin/Creatinine Ratio Urine (EXT): <15 ug/mg{creat} (ref <30)

## 2021-05-10 LAB — CMP (EXT)
ALT/SGPT (EXT): 18 U/L (ref 0–40)
AST/SGOT (EXT): 14 U/L (ref 0–33)
Albumin (EXT): 4.4 g/dL (ref 3.3–4.9)
Alkaline Phosphatase (EXT): 85 U/L (ref 0–130)
Anion Gap (EXT): 10 (ref 4–14)
BUN (EXT): 31 mg/dL — ABNORMAL HIGH (ref 6–20)
Bilirubin, Total (EXT): 0.8 mg/dL (ref 0.3–1.2)
CO2 (EXT): 28 meq/L (ref 20–31)
CalciumCalcium (EXT): 9.6 mg/dL (ref 8.4–10.2)
Chloride (EXT): 99 meq/L (ref 98–107)
Corrected Calcium (EXT): 9.3 mg/dL (ref 8.4–10.2)
Creatinine (EXT): 0.9 mg/dL (ref <1.3)
Glucose (EXT): 137 mg/dL — ABNORMAL HIGH (ref 70–99)
Potassium (EXT): 4.7 meq/L (ref 3.5–5.3)
Protein (EXT): 6.9 g/dL (ref 6.2–8.2)
Sodium (EXT): 137 meq/L (ref 135–146)
eGFR - Creat MDRD (EXT): 85 mL/min/{1.73_m2} (ref >60)

## 2021-05-10 LAB — PSA (EXT): PSA (EXT): 1.38 ng/mL (ref <4.00)

## 2021-05-10 LAB — LIPID PROFILE (EXT)
Chol/HDL Ratio (EXT): 4.52 (ref <4.96)
Cholesterol (EXT): 199 mg/dL (ref <200)
HDL Cholesterol (EXT): 44 mg/dL (ref >40)
LDL Cholesterol, CALC (EXT): 80 mg/dL (ref <129)
Triglycerides (EXT): 374 mg/dL — ABNORMAL HIGH (ref <150)

## 2021-05-10 LAB — HEMOGLOBIN A1C
Estimated Average Glucose mg/dL (INT/EXT): 184 mg/dL
HEMOGLOBIN A1C % (INT/EXT): 8.1 % — ABNORMAL HIGH (ref 0.0–5.6)

## 2021-08-19 ENCOUNTER — Encounter

## 2021-09-07 LAB — HEMOGLOBIN A1C
Estimated Average Glucose mg/dL (INT/EXT): 194 mg/dL
HEMOGLOBIN A1C % (INT/EXT): 8.4 % — ABNORMAL HIGH (ref 0.0–5.6)

## 2021-09-08 ENCOUNTER — Ambulatory Visit (INDEPENDENT_AMBULATORY_CARE_PROVIDER_SITE_OTHER): Admitting: Cardiovascular Disease

## 2021-10-19 ENCOUNTER — Encounter

## 2021-10-20 ENCOUNTER — Ambulatory Visit: Attending: Cardiovascular Disease | Admitting: Cardiovascular Disease

## 2021-10-20 ENCOUNTER — Other Ambulatory Visit

## 2021-10-20 VITALS — BP 129/88 | HR 84 | Wt 238.0 lb

## 2021-10-20 DIAGNOSIS — E119 Type 2 diabetes mellitus without complications: Secondary | ICD-10-CM

## 2021-10-20 MED ORDER — Jardiance 25 mg
25 | ORAL_TABLET | Freq: Every day | ORAL | 3 refills | Status: AC
Start: 2021-10-20 — End: ?

## 2021-10-20 NOTE — Progress Notes (Signed)
 Primary care: Scot Dock, MD  Cardiology: Crosby Oyster, MD  10/20/2021        Spencer Adams returns today for continued evaluation and management of Hyperlipidemia, diabetes, hypertension, abnormal ECG, dilated ascending aorta    He did very well with ganglion cyst removal by Dr. Thereasa Distance. He is now scheduled to see a dermatologist regarding a skin lesion on his upper back. 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  As of September 08, 2020, he has been active walking his cocker spaniel up to a mile with no symptoms.  Review of systems is negative for chest pain, dyspnea, PND, orthopnea, palpitations, edema, dizziness, syncope, TIA, stroke, claudication, bleeding, fatigue, and no amaurosis fugax.  He had a colonoscopy by Dr. Dorian Pod and had several tubular adenomas removed.    As of October 20, 2021, he says he is walking about a half a mile a day with no cardiac symptoms.    He said he had a number of blood test done recently but I can only find the A1c which was over 8.    ROS: CONSTITUTIONAL: No weight change.  RESP: No cough, hemoptysis.  GI:  No hematemesis, melena, hematochezia.  GU:  No hematuria.  NEURO:  No speech or visual disturbance, no focal extremity weakness.    Past Medical History   Hyperlipidemia.     Type II diabetes.     Morbid obestiy.     Hypertension.     Ganglion cyst.     Abdominal hernia repair with mesh.     Remote smoking in his 57s.     Family history of heart disease his sister and his father had vascular dementia.     abnormal ECG with inferior Q waves and poor R wave progression.     coronary angiogram around 2010 at [facility] which was reportedly normal.     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     Hypogonadism.       Cardiovascular Diagnostics   coronary angiogram around 2010 the [address] was reportedly normal.    ECHO 01/30/19: LVEF 60-65%. DD. Trace MR/AR/TR/PR. PASP normal. Ao root 3.9 cm/asc aorta 3.6 cm.    NUCLEAR ETT; 02/03/19: 6:00 Bruce; 5.8  METs; 23,360 DP; 94% APMHR. No sx/ST changes. No WMA. EF 54%. No ischemia + no infarct.    CT abd/pelvis 12-16-19 fatty liver, colon diverticulosis, atherosclerosis, DJD of spine      Family History   His father had vascular dementia. His sister had heart disease. His other sister and brother do not have heart.     Social History   Tobacco Use:   Smoking Patient is a Former smoker, How long has it been since you last smoked? > 10 years.   He exercises regularly.     Allergies   N.K.D.A.         Outpatient Medications Prior to Visit   Medication Sig Dispense Refill   ? aspirin 81 mg capsule Take 81 mg by mouth in the morning.     ? atorvastatin (Lipitor) 40 mg tablet Take 40 mg by mouth in the morning.     ? Jardiance 25 mg Take 25 mg by mouth in the morning.     ? lisinopril 20 mg tablet Take 20 mg by mouth in the morning.     ? metFORMIN XR (Glucophage-XR) 500 mg 24 hr tablet Take 1,000 mg by mouth in the morning and at bedtime.  No facility-administered medications prior to visit.      No Known Allergies     Cardiovascular Diagnostics  coronary angiogram around 2010 the [address] was reportedly normal.   ECHO 01/30/19: LVEF 60-65%. DD. Trace MR/AR/TR/PR. PASP normal. Ao root 3.9 cm/asc aorta 3.6 cm.   NUCLEAR ETT; 02/03/19: 6:00 Bruce; 5.8 METs; 23,360 DP; 94% APMHR. No sx/ST changes. No WMA. EF 54%. No ischemia + no infarct.   CT abd/pelvis 12-16-19 fatty liver, colon diverticulosis, atherosclerosis, DJD of spine         Physical Exam  Vitals 01/28/2019 07/23/2019 09/08/2020 10/20/2021   Systolic 137 137 742 129   Diastolic 89 89 78 88   Pulse 106 106 86 84   Temp - - 98 -   Height (in) 70 70 70 -   Weight (lb) 250 250 236 238   SpO2 96 96 96 96   BMI (kg/m2) 35.87 kg/m2 35.87 kg/m2 33.86 kg/m2 34.15 kg/m2   BSA (m2) 2.36 m2 2.36 m2 2.3 m2 2.31 m2   VISIT REPORT - - - -       GENERAL APPEARANCE:  Alert, well-nourished.  HEAD: Normocephalic.  EYES: Anicteric.  ENT: Moist mucous membranes.  NECK: No JVD, carotids full  with no bruits.  LYMPH: No cervical lymphadenopathy.  LUNGS: Clear to auscultation.  CARDIAC: Regular rhythm, normal S1, normal S2, no S3,  no murmur.  CHEST: Sternum and costochondral junctions nontender.  ABDOMEN: Soft, no organomegaly, no bruit, benign  MUSCULOSKELETAL: Nodular deformity to his distal interphalangeal joints  EXTREMITIES: No edema.  PERIPHERAL PULSES: Posterior tibial pulses palpable bilaterally. Radial pulses equal bilaterally.  NEUROLOGIC: Normal strength and sensation bilaterally. Fluent speech  PSYCH: Alert and oriented x3.    ECG: Sinus rhythm at 77 BPM with intervals 140/97/383 with poor R wave progression and with Q waves in lead III      LABS and TESTS:     IMPRESSION: Spencer Adams is doing well from his cardiac issues though his A1c is too high.  Ideally, he would exercise more, eat a more heart healthy diet and lose weight.    He says he does not feel very good when he weighs less.    I made no changes in his regimen.  Target LDL remains below 70.    The nodules on his fingers look most consistent with osteoarthritis but I wonder about tophaceous gout.  He has not had a uric acid level that I can find since 2017 when it was 7.7.  He does not recall ever having gout.    Without cardiac symptoms, I ordered no cardiac testing.      Current Outpatient Medications:   ?  aspirin 81 mg capsule, Take 81 mg by mouth in the morning., Disp: , Rfl:   ?  atorvastatin (Lipitor) 40 mg tablet, Take 40 mg by mouth in the morning., Disp: , Rfl:   ?  Jardiance 25 mg, Take 25 mg by mouth in the morning., Disp: , Rfl:   ?  lisinopril 20 mg tablet, Take 20 mg by mouth in the morning., Disp: , Rfl:   ?  metFORMIN XR (Glucophage-XR) 500 mg 24 hr tablet, Take 1,000 mg by mouth in the morning and at bedtime., Disp: , Rfl:     I spent 45 minutes today preparing for this visit, reviewing medications and tests, conducting face to face encounter with the patient, documenting clinical information in the electronic  medical record and coordinating patient's care.]  This note was generated with a voice recognition program. Please excuse any errors which may have been overlooked during review. Sometimes, these errors may affect the content or meaning of a given sentence    10/20/2021

## 2021-10-28 ENCOUNTER — Ambulatory Visit (INDEPENDENT_AMBULATORY_CARE_PROVIDER_SITE_OTHER): Admitting: Cardiovascular Disease

## 2021-12-13 LAB — CMP (EXT)
ALT/SGPT (EXT): 21 U/L (ref 0–40)
AST/SGOT (EXT): 16 U/L (ref 0–33)
Albumin (EXT): 4.6 g/dL (ref 3.3–4.9)
Alkaline Phosphatase (EXT): 76 U/L (ref 0–130)
Anion Gap (EXT): 13 (ref 4–14)
BUN (EXT): 16 mg/dL (ref 6–20)
Bilirubin, Total (EXT): 0.8 mg/dL (ref 0.3–1.2)
CO2 (EXT): 29 meq/L (ref 20–31)
CalciumCalcium (EXT): 10.2 mg/dL (ref 8.4–10.2)
Chloride (EXT): 101 meq/L (ref 98–107)
Corrected Calcium (EXT): 9.7 mg/dL (ref 8.4–10.2)
Creatinine (EXT): 0.9 mg/dL (ref <1.3)
Glucose (EXT): 118 mg/dL — ABNORMAL HIGH (ref 70–99)
Potassium (EXT): 4.4 meq/L (ref 3.5–5.3)
Protein (EXT): 7.4 g/dL (ref 6.2–8.2)
Sodium (EXT): 143 meq/L (ref 135–146)
eGFR - Creat MDRD (EXT): 89 mL/min/{1.73_m2} (ref >60)

## 2021-12-13 LAB — LIPID PROFILE (EXT)
Chol/HDL Ratio (EXT): 4.4 (ref <4.96)
Cholesterol (EXT): 198 mg/dL (ref <200)
HDL Cholesterol (EXT): 45 mg/dL (ref >40)
LDL Cholesterol, CALC (EXT): 88 mg/dL (ref <129)
Triglycerides (EXT): 327 mg/dL — ABNORMAL HIGH (ref <150)

## 2021-12-13 LAB — HEMOGLOBIN A1C
Estimated Average Glucose mg/dL (INT/EXT): 194 mg/dL
HEMOGLOBIN A1C % (INT/EXT): 8.4 % — ABNORMAL HIGH (ref 0.0–5.6)

## 2022-10-03 NOTE — Telephone Encounter (Signed)
PT moved to [facility]. Will be calling us requesting records sent to new cardio when he finds one.

## 2022-10-20 ENCOUNTER — Ambulatory Visit: Admitting: Cardiovascular Disease

## 2022-12-04 NOTE — Telephone Encounter (Signed)
Please make appt with DGL
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
